# Patient Record
Sex: Female | Born: 1970 | Race: White | Hispanic: No | Smoking: Current every day smoker
Health system: Southern US, Community
[De-identification: ages and names within clinical notes are randomized; demographics above are authoritative.]

## PROBLEM LIST (undated history)

## (undated) HISTORY — PX: ABDOMINAL HYSTERECTOMY: SHX81

---

## 2003-12-07 ENCOUNTER — Emergency Department: Payer: Self-pay | Admitting: Unknown Physician Specialty

## 2004-06-12 ENCOUNTER — Observation Stay: Payer: Self-pay | Admitting: Otolaryngology

## 2005-10-29 ENCOUNTER — Emergency Department: Payer: Self-pay | Admitting: Emergency Medicine

## 2006-09-14 ENCOUNTER — Emergency Department: Payer: Self-pay | Admitting: Emergency Medicine

## 2007-08-27 ENCOUNTER — Emergency Department: Payer: Self-pay | Admitting: Emergency Medicine

## 2007-09-17 ENCOUNTER — Emergency Department: Payer: Self-pay | Admitting: Internal Medicine

## 2007-12-01 ENCOUNTER — Emergency Department: Payer: Self-pay | Admitting: Emergency Medicine

## 2008-06-20 ENCOUNTER — Emergency Department: Payer: Self-pay

## 2010-06-11 ENCOUNTER — Ambulatory Visit: Payer: Self-pay | Admitting: Internal Medicine

## 2010-06-12 ENCOUNTER — Emergency Department: Payer: Self-pay | Admitting: Emergency Medicine

## 2010-09-01 ENCOUNTER — Emergency Department: Payer: Self-pay | Admitting: Emergency Medicine

## 2010-09-29 ENCOUNTER — Emergency Department: Payer: Self-pay | Admitting: Unknown Physician Specialty

## 2010-10-07 ENCOUNTER — Ambulatory Visit: Payer: Self-pay | Admitting: Internal Medicine

## 2011-03-31 ENCOUNTER — Ambulatory Visit: Payer: Self-pay | Admitting: Obstetrics and Gynecology

## 2011-03-31 LAB — CBC
HCT: 38.8 % (ref 35.0–47.0)
HGB: 13.1 g/dL (ref 12.0–16.0)
MCH: 30.2 pg (ref 26.0–34.0)
MCHC: 33.7 g/dL (ref 32.0–36.0)
Platelet: 312 10*3/uL (ref 150–440)
RDW: 13.4 % (ref 11.5–14.5)
WBC: 8 10*3/uL (ref 3.6–11.0)

## 2011-03-31 LAB — BASIC METABOLIC PANEL
BUN: 14 mg/dL (ref 7–18)
Creatinine: 0.84 mg/dL (ref 0.60–1.30)
EGFR (Non-African Amer.): >60
Glucose: 92 mg/dL (ref 65–99)
Osmolality: 287 (ref 275–301)
Potassium: 4.1 mmol/L (ref 3.5–5.1)
Sodium: 144 mmol/L (ref 136–145)

## 2011-04-06 ENCOUNTER — Ambulatory Visit: Payer: Self-pay | Admitting: Obstetrics and Gynecology

## 2011-04-06 LAB — PREGNANCY, URINE: Pregnancy Test, Urine: NEGATIVE m[IU]/mL

## 2011-04-07 LAB — HEMOGLOBIN: HGB: 10.9 g/dL — ABNORMAL LOW (ref 12.0–16.0)

## 2011-04-10 LAB — PATHOLOGY REPORT

## 2011-10-28 ENCOUNTER — Ambulatory Visit: Payer: Self-pay | Admitting: Internal Medicine

## 2012-08-01 IMAGING — CT CT ABD-PELV W/O CM
1 of 2 series · 15 of 32 positions shown, 19 images · non-contrast
Comparison: none

REASON FOR EXAM: (1) left flank pain; (2) left flank pain
COMMENTS:

PROCEDURE:     CT  - CT ABDOMEN AND PELVIS W[DATE]  [DATE]
RESULT:     Comparison: 12/02/2007
TECHNIQUE: Multiple axial images from the lung bases to the symphysis pubis
were obtained without oral and without intravenous contrast.

[Series 2: stone · axial · 0.91mm/px · z∈[-538,-124]mm · 15 of 150 slices shown, 19 images]
[im 6/150  soft-tissue]
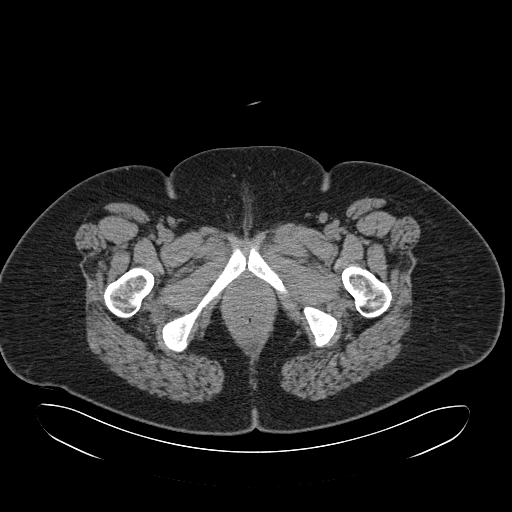
[im 6/150  bone]
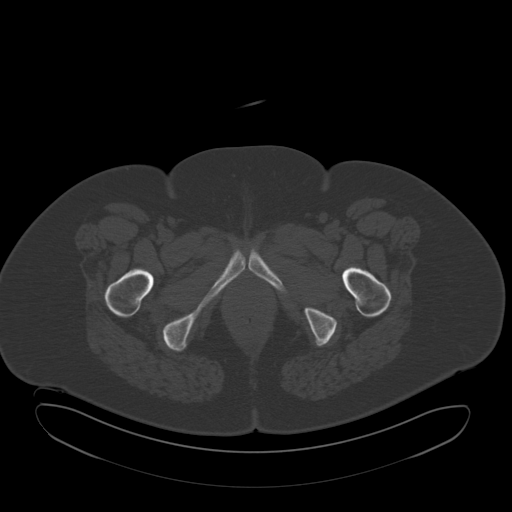
[im 18/150  soft-tissue]
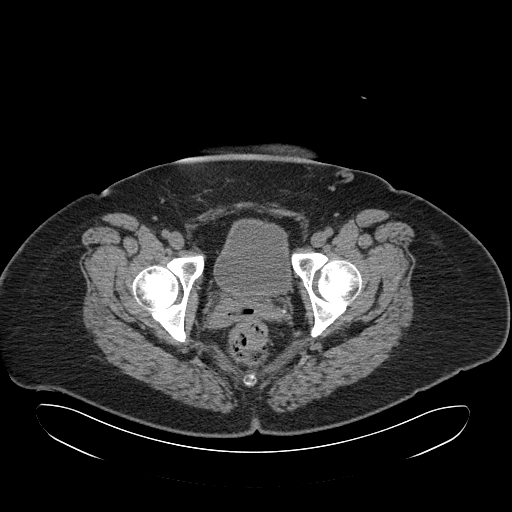
[im 29/150  soft-tissue]
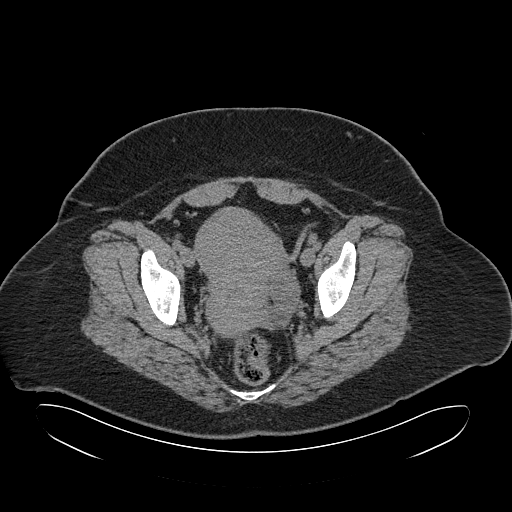
[im 41/150  soft-tissue]
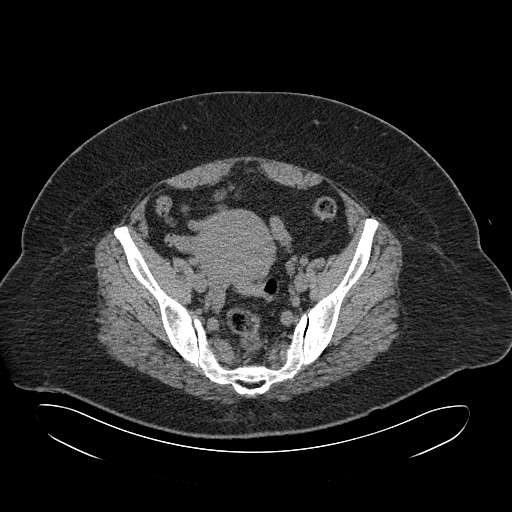
[im 52/150  soft-tissue]
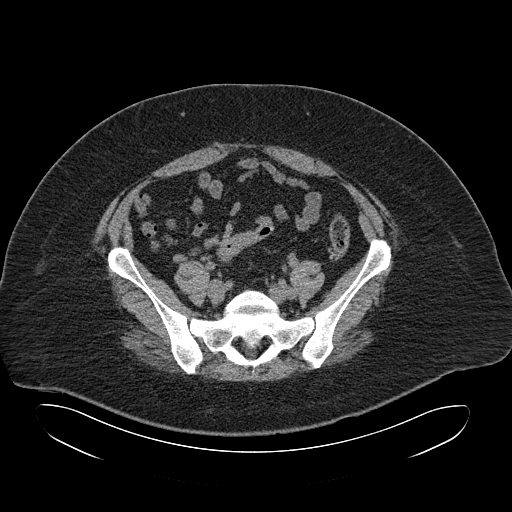
[im 64/150  soft-tissue]
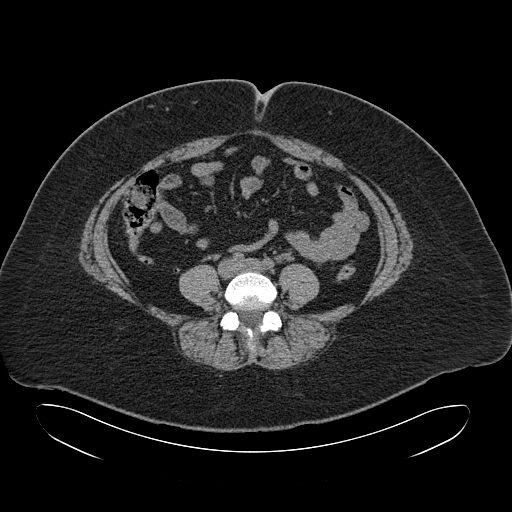
[im 75/150  soft-tissue]
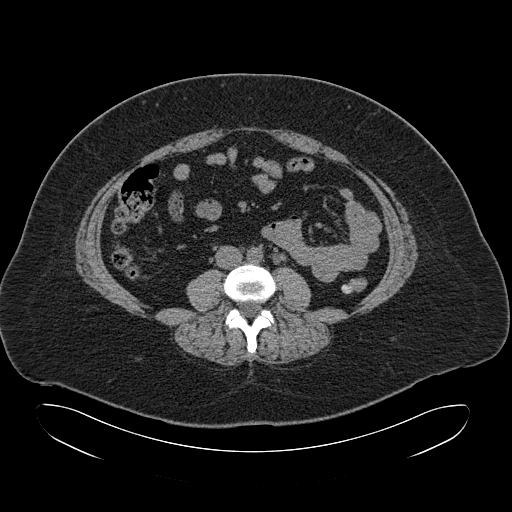
[im 86/150  soft-tissue]
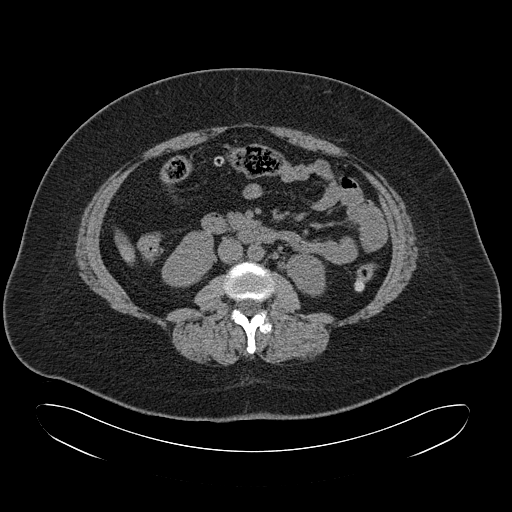
[im 98/150  soft-tissue]
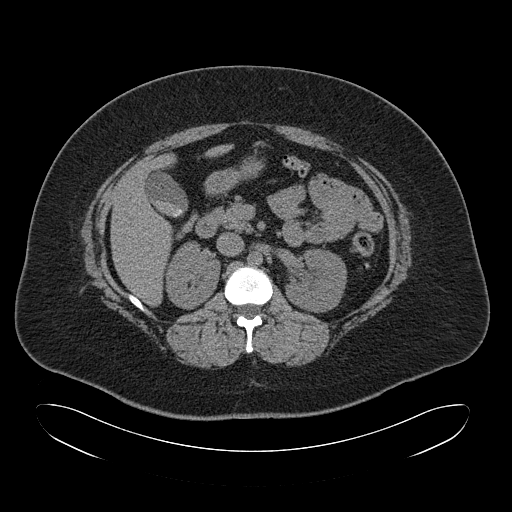
[im 98/150  bone]
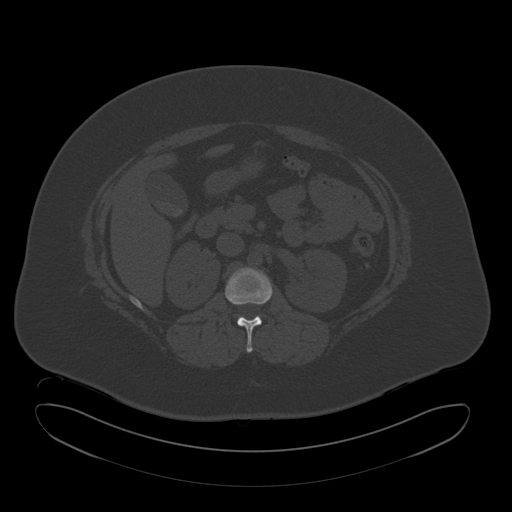
[im 109/150  soft-tissue]
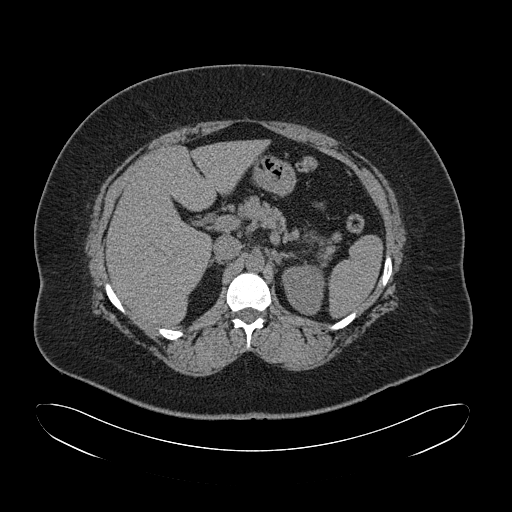
[im 121/150  soft-tissue]
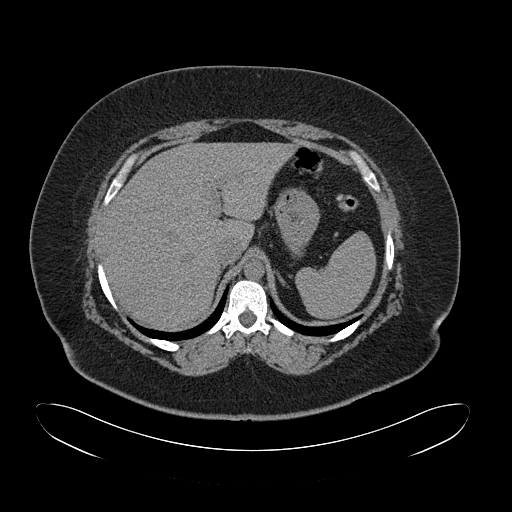
[im 127/150  lung]
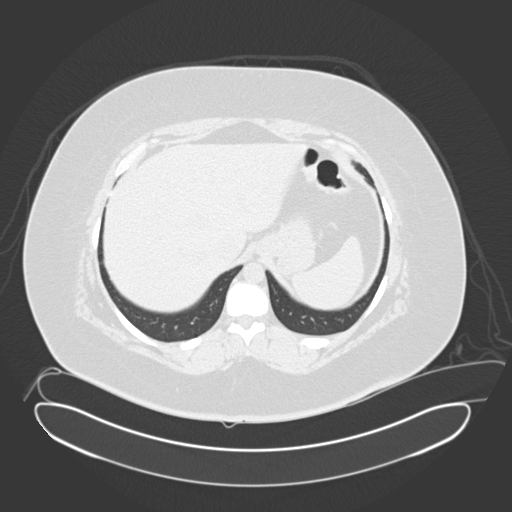
[im 132/150  soft-tissue]
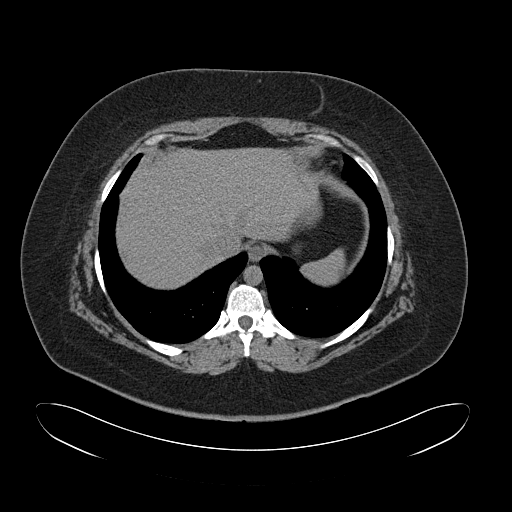
[im 132/150  lung]
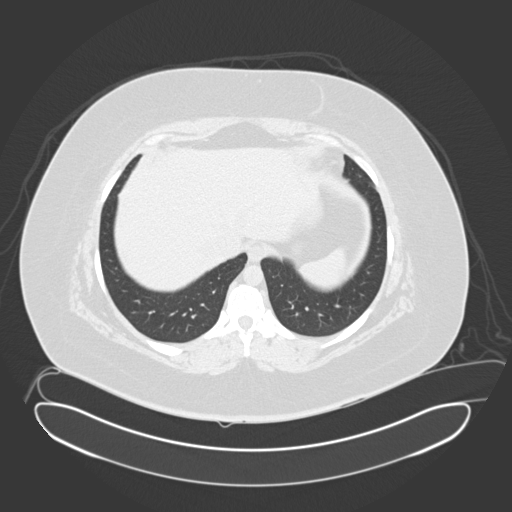
[im 138/150  lung]
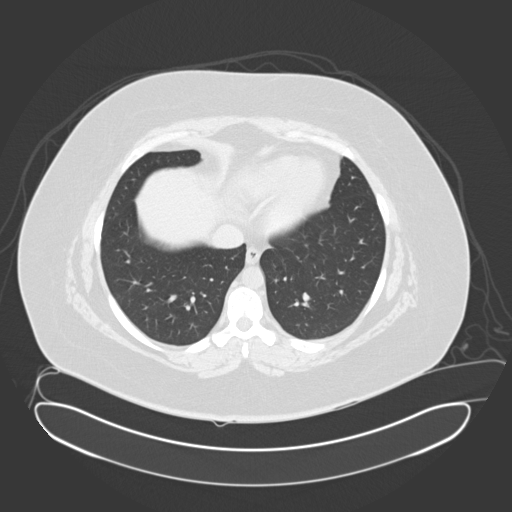
[im 144/150  soft-tissue]
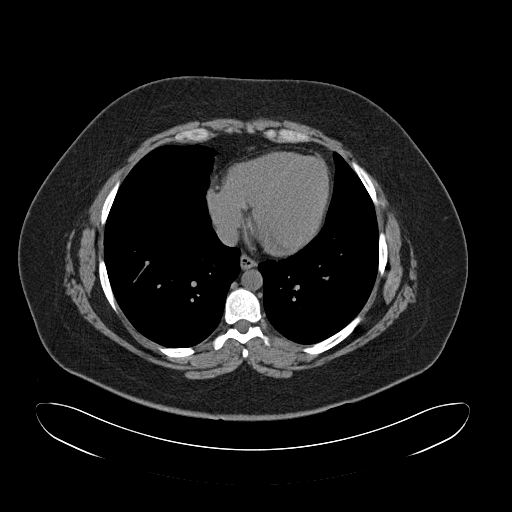
[im 144/150  lung]
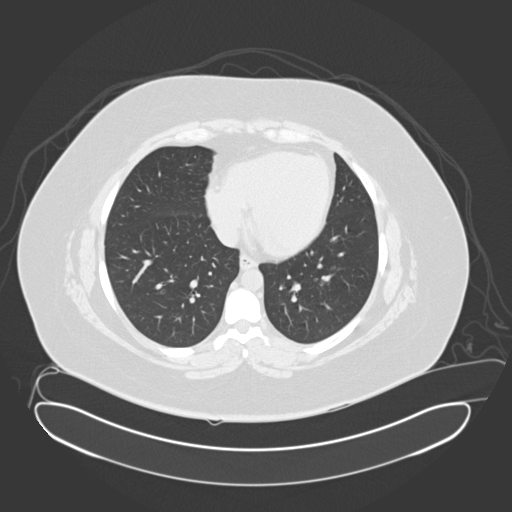

[15 of 32 positions shown; findings below may reference images not displayed]

FINDINGS: Lack of intravenous contrast limits evaluation of the solid abdominal
organs.  Grossly, the liver, spleen, adrenals, and pancreas are
unremarkable. There are multiple stones in the gallbladder. No calculi
identified within the kidneys. There is a 4 mm calculi left uterovesical
junction. There is mild dilatation of the left ureter and left kidney.

The uterus is mildly enlarged, likely related to fibroids. There is
diverticulosis in the transverse colon, descending colon, and sigmoid colon.
The appendix is normal.

No aggressive lytic or sclerotic osseous lesions identified.
IMPRESSION: 1. 4 mm calculus at the left ureterovesical junction causing mild proximal
obstruction.
2. Cholelithiasis.

## 2012-10-31 ENCOUNTER — Emergency Department: Payer: Self-pay | Admitting: Emergency Medicine

## 2012-11-26 IMAGING — US US PELV - US TRANSVAGINAL
1 series · 17 of 25 positions shown · non-contrast
Comparison: none

REASON FOR EXAM: Uterine fibroids
COMMENTS:

[Series 1: us pelv - us transvaginal · 17 of 87 slices shown]
[im 1/87]
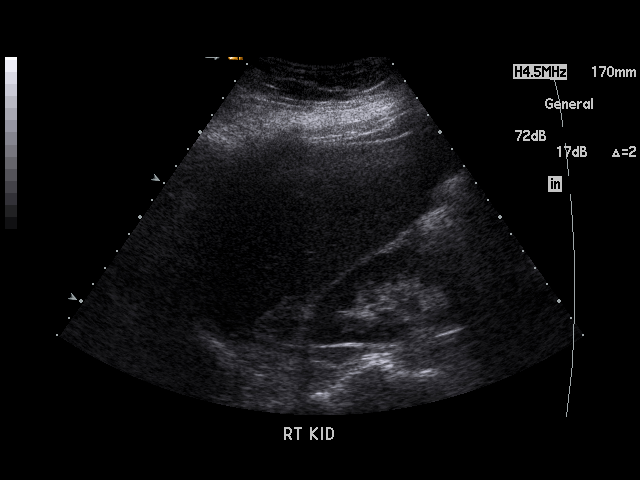
[im 8/87]
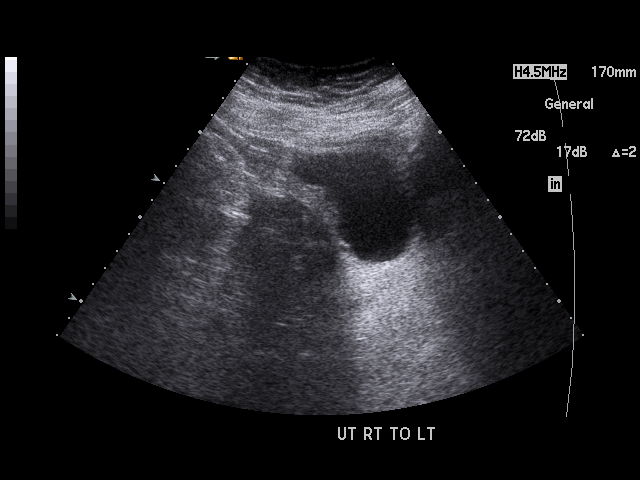
[im 11/87]
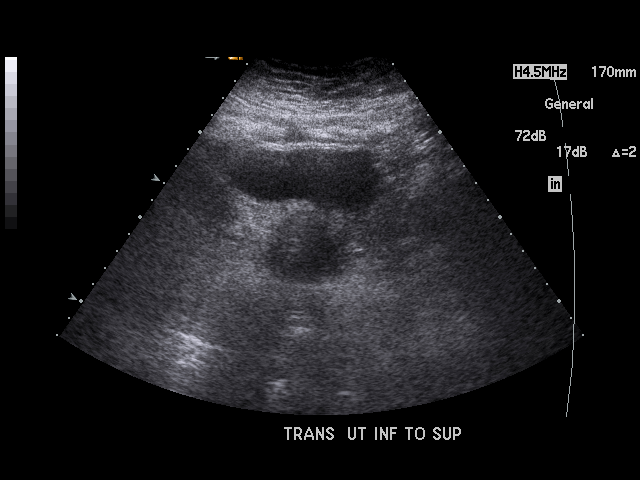
[im 18/87]
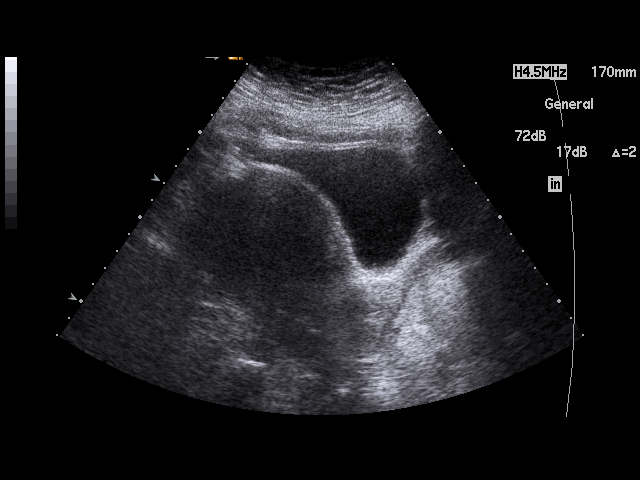
[im 22/87]
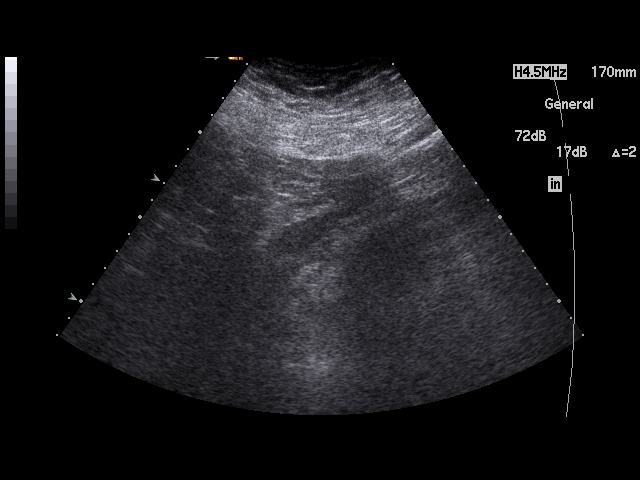
[im 29/87]
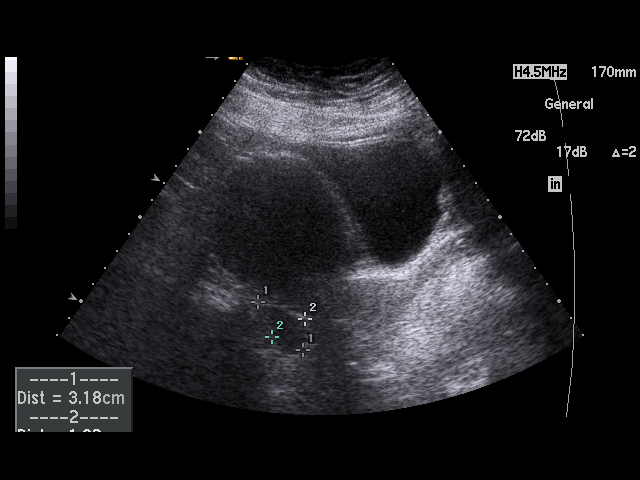
[im 33/87]
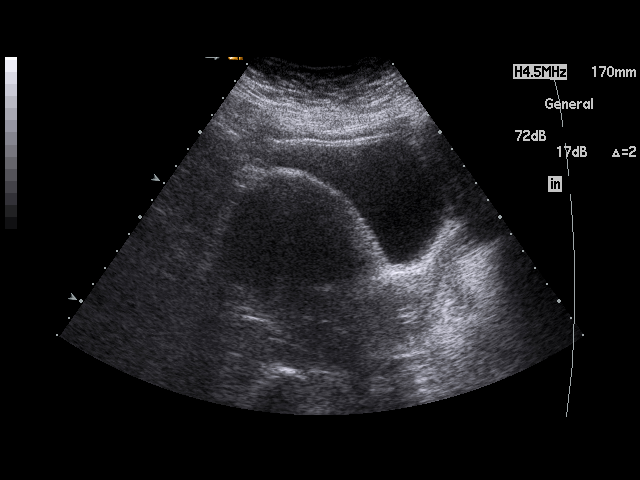
[im 40/87]
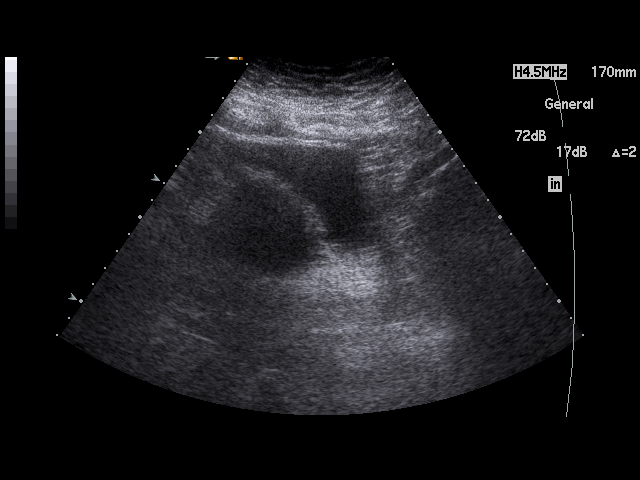
[im 44/87]
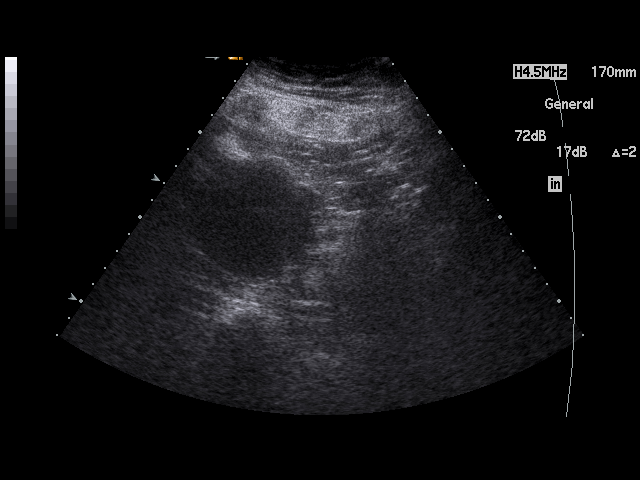
[im 47/87]
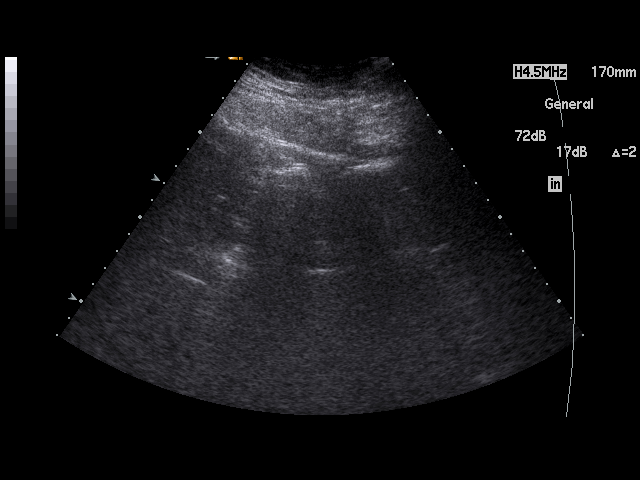
[im 54/87]
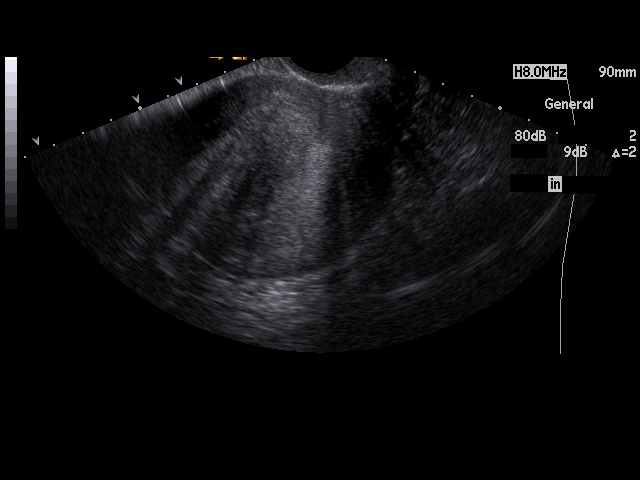
[im 58/87]
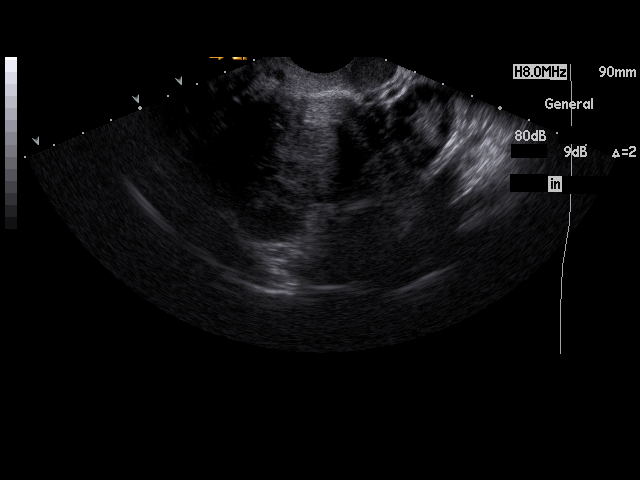
[im 65/87]
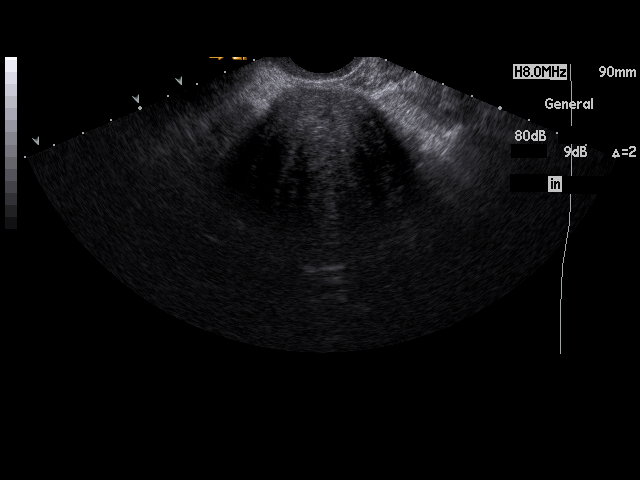
[im 69/87]
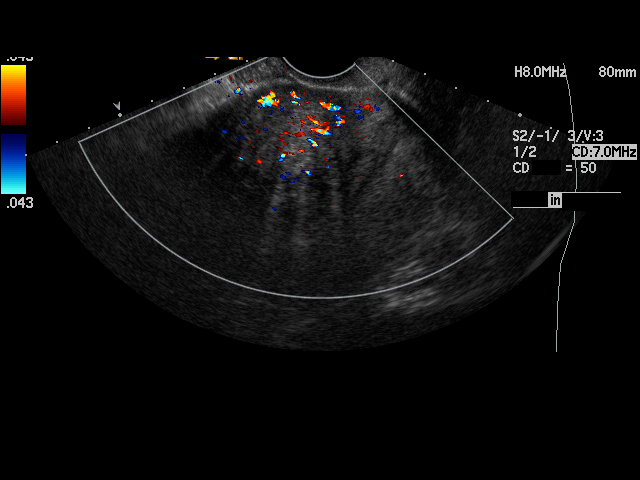
[im 76/87]
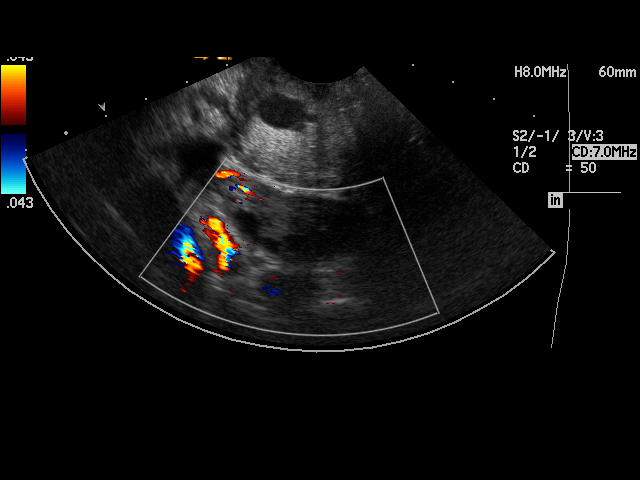
[im 79/87]
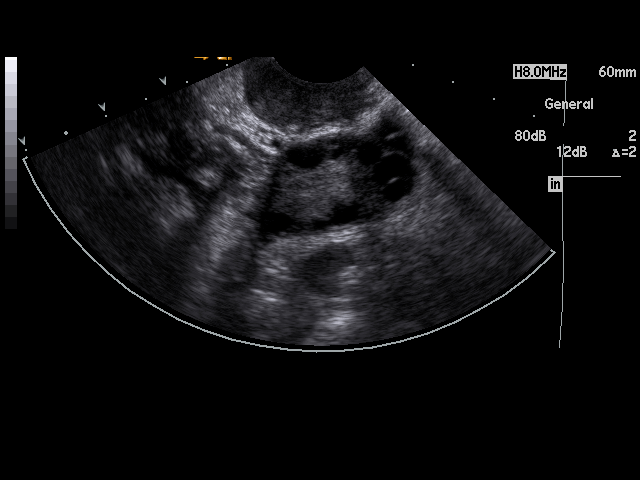
[im 87/87]
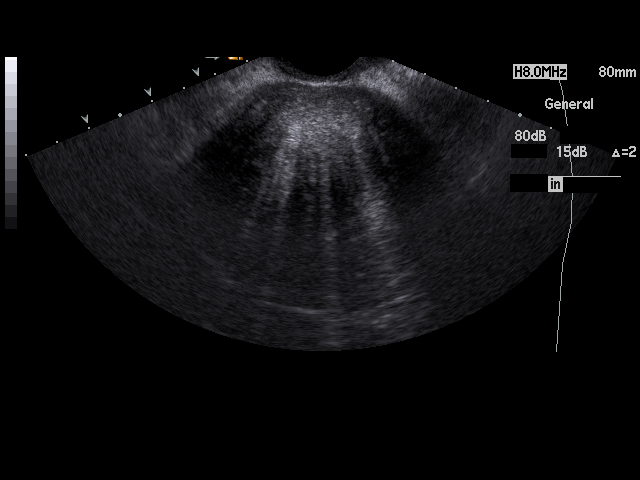

[17 of 25 positions shown; findings below may reference images not displayed]

PROCEDURE:     US  - US PELVIS EXAM W/TRANSVAGINAL  - October 07, 2010  [DATE]

RESULT:     Transabdominal and endovaginal ultrasound was performed. The
uterus measures 11.27 cm x 6.66 cm x 6.63 cm. There is a mass in the uterine
fundus consistent with a uterine fibroid that measures 5.37 cm at maximum
diameter. The endometrium is displaced and obscured by the fundal mass. No
additional masses are identified. The right and left ovaries are visualized.
The right ovary measures 3.36 cm at maximum diameter and the left ovary
measures 3.7 cm at maximum diameter. No abnormal adnexal masses are seen. No
free fluid is seen in the pelvis. The visualized portion of the urinary
bladder shows no intrinsic abnormalities but is indented extrinsically by
the fundal uterine mass. The kidneys are visualized bilaterally and show no
hydronephrosis.
IMPRESSION: 1. There is a 5.37 cm mass of the uterine fundus, likely representing a
uterine fibroid. Adenomyosis is also a consideration but is considered less
likely.
2. No abnormal adnexal masses are seen.
3. The kidneys show no hydronephrosis.

## 2013-04-23 ENCOUNTER — Emergency Department: Payer: Self-pay | Admitting: Emergency Medicine

## 2014-05-20 NOTE — H&P (Signed)
PATIENT NAME:  Verne CarrowVELAZQUEZ, Nimrit N MR#:  [id number] DATE OF BIRTH:  April 26, 1970  DATE OF ADMISSION:  04/06/2011  PREOPERATIVE DIAGNOSES:  1. Menorrhagia.  2. Uterine fibroids.   HISTORY OF PRESENT ILLNESS:  Wilhelmina Mcardlelizabeth Quiett is a single white female, para 2-0-0-2, who presents for surgical management of symptomatic fibroids with a long history of menorrhagia and severe dysmenorrhea. The patient has been refractory to conservative medical therapies. Her bleeding lasts anywhere from 7 to 9 days in duration has been heavy with clots soaking through pads and tampons frequently. She also has associated deep thrust dyspareunia and severe dysmenorrhea that has required extreme doses of Advil on a daily basis. She had an attempt at Norplant, which was ineffective. She desires to proceed with definitive surgery at this time.   PAST MEDICAL HISTORY:  1. Hyperlipidemia.  2. Asthma.  3. Vitamin D deficiency.  4. Increased body mass index.   PAST SURGICAL HISTORY:  1. Cesarean section times two.  2. Bilateral tubal ligation. 3. Oral surgery.  4. Norplant removal.   PAST OB HISTORY:  Para 1-0-2-0?Marland Kitchen.  G1 was a 5-pounds 6-ounce female having had a C-section done for fetal distress. G2 was a 4-pound 4-ounce female. Repeat section done secondary to premature labor at 32 weeks. BTL was done at that time.   PAST GYN HISTORY: Menarche at age 44. The patient denies history of abnormal Pap smears. She denies history of pelvic inflammatory disease or STI. The patient states that her cycles have been progressively worse since Norplant removal.   FAMILY HISTORY: Negative for cancer of the colon or ovary. There is a history of breast cancer in maternal grandmother and maternal great aunt. Mom had cervical cancer.   SOCIAL HISTORY: The patient smokes one-quarter pack of cigarettes daily. She drinks alcohol socially. She denies drug use. 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   REVIEW OF SYSTEMS: The  patient denies recent illness. She denies history of coagulopathy. She does have history of asthma without recent exacerbation.   CURRENT MEDICATIONS:  1. Simvastatin 20 mg a day.  2. Albuterol inhaler q.i.d. p.r.n.  3. Vitamin. D3 capsule 1000 units daily.   DRUG ALLERGIES: None.   PHYSICAL EXAMINATION:  VITAL SIGNS: Height 5 feet 4 inches, weight 234. BMI 40. Blood pressure 110/81, heart rate 86.   GENERAL: The patient a pleasant, well-appearing white female, obese, in no acute distress. She is alert and oriented. Affect is appropriate.   HEENT: Oropharynx clear.   NECK: Supple. There is no thyromegaly or adenopathy.   LUNGS: Clear.   HEART: Regular rhythm without murmur.   ABDOMEN: Soft, flat, nontender, nondistended, no organomegaly. No pelvic masses palpable.   PELVIC: External genitalia normal. BUS normal. Vagina has minimal blood in the vault. Cervix is without gross lesions. Bimanual exam reveals a midplane uterus that is approximately 8-10 weeks in size. It is 1-2/4 tender. Adnexa are nonpalpable and nontender.   RECTAL: Normal sphincter tone. No rectal masses. The posterior aspect of the uterus is tender without palpable nodularity.   LABORATORY DATA:  Endometrial biopsy on 02/16/2011 revealed secretory endometrium without hyperplasia or carcinoma. Ultrasound  10/07/2010 revealed enlarged uterus with 5.37-cm mass in the fundus consistent with fibroids. The ovaries appeared grossly normal.   IMPRESSION:  1. Symptomatic fibroid uterus.  2. Possible endometriosis/adenomyosis.   PLAN: Laparoscopic-assisted vaginal hysterectomy.  Date of surgery is 04/06/2011.   CONSENT NOTE: Wilhelmina Mcardlelizabeth Sada is to undergo LAVH for symptomatic fibroid uterus and possible adenomyosis. She is  understanding of the planned procedure and is aware of and accepting of all surgical risks which include but are not limited to bleeding, infection, pelvic organ injury with need for repair, blood clot  disorders, anesthesia risks, and death. All questions are answered. Informed consent is given. The patient is ready and willing to proceed with surgery as scheduled.   ____________________________ Prentice Docker Felesia Stahlecker, MD mad:bjt D: 03/31/2011 17:47:08 ET T: 03/31/2011 18:05:02 ET JOB#: [id number]  cc: Daphine Deutscher A. Donata Reddick, MD, <Dictator> Beryle Quant. Toya Smothers, MD Prentice Docker Tysheem Accardo MD ELECTRONICALLY SIGNED 04/03/2011 14:11

## 2014-05-20 NOTE — Op Note (Signed)
PATIENT NAME:  Sharon Terrell, Sharon Terrell MR#:  [id number] DATE OF BIRTH:  1970-07-15  DATE OF PROCEDURE:  04/06/2011  PREOPERATIVE DIAGNOSIS: Symptomatic fibroid uterus.   POSTOPERATIVE DIAGNOSES:  1. Symptomatic fibroid uterus.  2. Pelvic adhesions.  3. Bilateral ovarian cyst, simple.   OPERATIVE PROCEDURES: Laparoscopic-assisted vaginal hysterectomy with morcellation and adhesiolysis.   SURGEON: Prentice DockerMartin A. Othell Diluzio, M.D.   FIRST ASSISTANT: Cassell ClementLynde Knowles-Jonas, M.D.   ANESTHESIA: General endotracheal.   INDICATIONS: Sharon Terrell is a 44 year old single white female, Para 2-0-0-2, who presents for surgical management of symptomatic fibroid uterus. The patient has had a long history of menorrhagia and severe dysmenorrhea; she has had work-up that has revealed fibroids. Conservative medical therapy has not alleviated her symptomatology. Because of her prolonged menses lasting upwards of nine days associated with heavy clots and dysmenorrhea, the patient desires definitive treatment at this time.   FINDINGS AT SURGERY: Findings at surgery revealed a 14 week size fibroid uterus. There were omental and anterior abdominal wall adhesions that had to be lysed. There were simple bilateral ovarian cysts noted.   DESCRIPTION OF PROCEDURE: The patient was brought to the Operating Room where she was placed in the supine position. General endotracheal anesthesia was induced without difficulty. She was placed in the bumblebee stirrups. A ChloraPrep Betadine abdominal, perineal, intravaginal prep and drape was performed in the standard fashion. A Foley catheter was placed and the bladder was draining clear yellow urine. A Hulka tenaculum was placed onto the cervix to facilitate uterine manipulation. The laparoscopic portion of the procedure was performed in the standard fashion. A subumbilical vertical incision 5 mm in length was made. The Optiview laparoscopic trocar system was used to place the 5 mm  trocar in the subumbilical incision through direct entry. No bowel or vascular injury was encountered. Two 5 mm ports were placed in the right and left lower quadrants, respectively under direct visualization. The Ace Harmonic scalpel as well as Kleppinger bipolar forceps and graspers were used to facilitate the abdominal portion of the hysterectomy. The uterine cornu was grasped. Sequentially, the round ligament was clamped, coagulated, and cut using the Harmonic scalpel. The utero-ovarian ligament along with the fallopian tube were clamped coagulated, and desiccated using the Harmonic scalpel. The cardinal broad ligament complex was taken down with the Harmonic scalpel. The anterior and posterior leafs of the broad ligament were opened. The bladder flap was created with the Harmonic scalpel. The uterine arteries were dissected out and visible. Kleppinger bipolar forceps were used to cauterize the uterine vessels. This was done in a bilateral approach. Once both sides were completed, attention was turned to the vaginal approach of the surgery. The legs were placed from the low lithotomy to the high lithotomy position. The Hulka tenaculum was removed and a double tooth tenaculum was placed onto the cervix. Posterior colpotomy was made with Mayo scissors. The uterosacral ligaments were cross clamped, cut, and stick tied using 0 Vicryl. The cervix was circumscribed with a scalpel. The cardinal broad ligament complexes were then sequentially clamped, cut, and stick tied using 0 Vicryl sutures. This was carried out until the specimen was 90% mobilized. Coring of the uterus was then performed to debulk the uterus so it could be removed vaginally. Following coring and bivalving of the uterus, the specimen was removed. There was some denuding of the posterior serosa and this area had to be reapproximated along with the posterior cuff in order to control some mild bleeding. A running baseball stitch of 0 chromic was  used  from uterosacral to uterosacral ligament to facilitate hemostasis. The vagina was closed with 2-0 chromic sutures in a simple interrupted manner. Once the vagina was closed and the uterosacral ligament ties were cross tied, a repeat laparoscopy was performed. Irrigation of the pelvis was performed along with aspiration of the irrigant fluid. All pedicles were hemostatic. The procedure was then terminated with all instrumentation being removed from the abdominopelvic cavity. The pneumoperitoneum was released. The incisions were closed with Dermabond adhesive. The patient was then awakened, extubated, and taken to the recovery room in satisfactory condition. Estimated blood loss was 400 milliliters. IV fluids infused were 2,500 mL. Urine output was 500 mL. All instruments, needle, and sponges were verified as correct. The patient did receive Ancef 2 grams antibiotic prophylaxis. The ureters were noted to be having normal peristalsis post hysterectomy.  ____________________________ Prentice Docker Imran Nuon, MD mad:ap D: 04/06/2011 16:28:36 ET           T: 04/06/2011 17:02:28 ET                 JOB#: [id number] cc: Daphine Deutscher A. Oswin Johal, MD, <Dictator> Beryle Quant. Toya Smothers, MD Prentice Docker Airon Sahni MD ELECTRONICALLY SIGNED 04/08/2011 13:06

## 2016-03-31 ENCOUNTER — Emergency Department: Payer: Managed Care, Other (non HMO)

## 2016-03-31 ENCOUNTER — Encounter: Payer: Self-pay | Admitting: Emergency Medicine

## 2016-03-31 ENCOUNTER — Emergency Department
Admission: EM | Admit: 2016-03-31 | Discharge: 2016-03-31 | Disposition: A | Discharge status: Home / Self Care | Payer: Managed Care, Other (non HMO) | Attending: Emergency Medicine | Admitting: Emergency Medicine

## 2016-03-31 DIAGNOSIS — Y999 Unspecified external cause status: Secondary | ICD-10-CM | POA: Diagnosis not present

## 2016-03-31 DIAGNOSIS — Y939 Activity, unspecified: Secondary | ICD-10-CM | POA: Diagnosis not present

## 2016-03-31 DIAGNOSIS — Z23 Encounter for immunization: Secondary | ICD-10-CM | POA: Diagnosis not present

## 2016-03-31 DIAGNOSIS — Y929 Unspecified place or not applicable: Secondary | ICD-10-CM | POA: Insufficient documentation

## 2016-03-31 DIAGNOSIS — F17210 Nicotine dependence, cigarettes, uncomplicated: Secondary | ICD-10-CM | POA: Diagnosis not present

## 2016-03-31 DIAGNOSIS — W268XXA Contact with other sharp object(s), not elsewhere classified, initial encounter: Secondary | ICD-10-CM | POA: Insufficient documentation

## 2016-03-31 DIAGNOSIS — S91132A Puncture wound without foreign body of left great toe without damage to nail, initial encounter: Secondary | ICD-10-CM | POA: Insufficient documentation

## 2016-03-31 DIAGNOSIS — S99922A Unspecified injury of left foot, initial encounter: Secondary | ICD-10-CM | POA: Diagnosis present

## 2016-03-31 DIAGNOSIS — L03116 Cellulitis of left lower limb: Secondary | ICD-10-CM

## 2016-03-31 DIAGNOSIS — S91135A Puncture wound without foreign body of left lesser toe(s) without damage to nail, initial encounter: Secondary | ICD-10-CM | POA: Diagnosis not present

## 2016-03-31 MED ORDER — TETANUS-DIPHTH-ACELL PERTUSSIS 5-2.5-18.5 LF-MCG/0.5 IM SUSP
0.5000 mL | Freq: Once | INTRAMUSCULAR | Status: AC
  Administered 2016-03-31: 0.5 mL via INTRAMUSCULAR
  Filled 2016-03-31: qty 0.5

## 2016-03-31 MED ORDER — CEPHALEXIN 500 MG PO CAPS
500.0000 mg | ORAL_CAPSULE | Freq: Two times a day (BID) | ORAL | 0 refills | Status: AC
Start: 2016-03-31 — End: ?

## 2016-03-31 MED ORDER — CEPHALEXIN 500 MG PO CAPS
500.0000 mg | ORAL_CAPSULE | Freq: Once | ORAL | Status: AC
  Administered 2016-03-31: 500 mg via ORAL
  Filled 2016-03-31: qty 1

## 2016-03-31 NOTE — ED Notes (Signed)
See triage note  States she stepped on a toothpick Sunday  Was able to pull the toothpick out  Now having increased pain with swelling and some redness

## 2016-03-31 NOTE — ED Triage Notes (Signed)
Patient ambulatory to triage with steady gait, without difficulty or distress noted; pt reports having stepped on a toothpick Sunday that she removed (between great toe and 2nd toe, sole side) but cont to have pain, now with redness

## 2016-03-31 NOTE — ED Provider Notes (Signed)
[facility] Emergency Department Provider Note   ____________________________________________    I have reviewed the triage vital signs and the nursing notes.   HISTORY  Chief Complaint Foreign Body     HPI Sharon Terrell is a 46 y.o. female resist with complaints of pain and redness in her left foot. Patient reports she stepped on a toothpick while barefoot, it punctured her skin between her first and second toe on the left foot. He is 100% certain that the toothpick was intact when she removed it. This occurred 2 days ago. Today she woke up with significant pain and redness spreading from the area. No purulent discharge no evidence of abscess   History reviewed. No pertinent past medical history.  There are no active problems to display for this patient.   Past Surgical History:  Procedure Laterality Date  . ABDOMINAL HYSTERECTOMY    . CESAREAN SECTION      Prior to Admission medications   Medication Sig Start Date End Date Taking? Authorizing Provider  cephALEXin (KEFLEX) 500 MG capsule Take 1 capsule (500 mg total) by mouth 2 (two) times daily. 03/31/16   Jene Every, MD     Allergies Solu-medrol [methylprednisolone]  No family history on file.  Social History Social History  Substance Use Topics  . Smoking status: Current Every Day Smoker    Types: Cigarettes  . Smokeless tobacco: Never Used  . Alcohol use No    Review of Systems  Constitutional: No fever/chills     Gastrointestinal: No nausea, no vomiting.    Musculoskeletal: Foot pain as above Skin: Redness as above     ____________________________________________   PHYSICAL EXAM:  VITAL SIGNS: ED Triage Vitals [03/31/16 0643]  Enc Vitals Group     BP (!) 129/92     Pulse Rate 71     Resp 18     Temp 98.4 F (36.9 C)     Temp Source Oral     SpO2 97 %     Weight 200 lb (90.7 kg)     Height 5\' 4"  (1.626 m)     Head Circumference      Peak  Flow      Pain Score 10     Pain Loc      Pain Edu?      Excl. in GC?      Constitutional: Alert and oriented. No acute distress. Pleasant and interactive Eyes: Conjunctivae are normal.   Nose: No congestion/rhinnorhea. Mouth/Throat: Mucous membranes are moist.   Cardiovascular: Normal rate, regular rhythm.  Respiratory: Normal respiratory effort.  No retractions.  Musculoskeletal: Small puncture wound noted between the first and second toes in the left foot. No purulent discharge, no fluctuance, erythema spreading proximally up the foot from the site consistent with cellulitis. No significant edema. Warm and well perfused Neurologic:  Normal speech and language. No gross focal neurologic deficits are appreciated.   Skin:  Skin is warm, dry. Please see above   ____________________________________________   LABS (all labs ordered are listed, but only abnormal results are displayed)  Labs Reviewed - No data to display ____________________________________________  EKG   ____________________________________________  RADIOLOGY  None ____________________________________________   PROCEDURES  Procedure(s) performed: No    Critical Care performed: No ____________________________________________   INITIAL IMPRESSION / ASSESSMENT AND PLAN / ED COURSE  Pertinent labs & imaging results that were available during my care of the patient were reviewed by me and considered in my medical decision  making (see chart for details).  Well-appearing and in no acute distress. Vital signs reassuring. Exam is consistent with cellulitis, we will treat with Keflex and update tetanus. Toothpick did not go through a shoe. No concern about retained foreign body   ____________________________________________   FINAL CLINICAL IMPRESSION(S) / ED DIAGNOSES  Final diagnoses:  Cellulitis of left lower extremity      NEW MEDICATIONS STARTED DURING THIS VISIT:  New Prescriptions    CEPHALEXIN (KEFLEX) 500 MG CAPSULE    Take 1 capsule (500 mg total) by mouth 2 (two) times daily.     Note:  This document was prepared using Dragon voice recognition software and may include unintentional dictation errors.    Jene Everyobert Chesnie Capell, MD 03/31/16 [phone]
